# Patient Record
Sex: Male | Born: 1965 | Race: White | Hispanic: No | Marital: Married | Smoking: Current every day smoker
Health system: Southern US, Community
[De-identification: ages and names within clinical notes are randomized; demographics above are authoritative.]

## PROBLEM LIST (undated history)

## (undated) DIAGNOSIS — E119 Type 2 diabetes mellitus without complications: Secondary | ICD-10-CM

## (undated) DIAGNOSIS — S4291XA Fracture of right shoulder girdle, part unspecified, initial encounter for closed fracture: Secondary | ICD-10-CM

## (undated) DIAGNOSIS — R42 Dizziness and giddiness: Secondary | ICD-10-CM

## (undated) DIAGNOSIS — R519 Headache, unspecified: Secondary | ICD-10-CM

## (undated) DIAGNOSIS — F329 Major depressive disorder, single episode, unspecified: Secondary | ICD-10-CM

## (undated) DIAGNOSIS — E559 Vitamin D deficiency, unspecified: Secondary | ICD-10-CM

## (undated) DIAGNOSIS — F32A Depression, unspecified: Secondary | ICD-10-CM

## (undated) DIAGNOSIS — E7800 Pure hypercholesterolemia, unspecified: Secondary | ICD-10-CM

## (undated) DIAGNOSIS — F419 Anxiety disorder, unspecified: Secondary | ICD-10-CM

## (undated) HISTORY — DX: Dizziness and giddiness: R42

## (undated) HISTORY — DX: Pure hypercholesterolemia, unspecified: E78.00

## (undated) HISTORY — DX: Fracture of right shoulder girdle, part unspecified, initial encounter for closed fracture: S42.91XA

## (undated) HISTORY — DX: Headache, unspecified: R51.9

## (undated) HISTORY — DX: Type 2 diabetes mellitus without complications: E11.9

## (undated) HISTORY — DX: Vitamin D deficiency, unspecified: E55.9

## (undated) HISTORY — DX: Anxiety disorder, unspecified: F41.9

## (undated) HISTORY — DX: Depression, unspecified: F32.A

---
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
## 2019-05-27 DIAGNOSIS — R569 Unspecified convulsions: Secondary | ICD-10-CM

## 2019-05-27 HISTORY — DX: Unspecified convulsions: R56.9

## 2019-06-09 ENCOUNTER — Encounter: Payer: Self-pay | Admitting: *Deleted

## 2019-06-09 ENCOUNTER — Other Ambulatory Visit: Payer: Self-pay

## 2019-06-09 ENCOUNTER — Ambulatory Visit (INDEPENDENT_AMBULATORY_CARE_PROVIDER_SITE_OTHER): Payer: 59 | Admitting: Diagnostic Neuroimaging

## 2019-06-09 VITALS — BP 157/89 | HR 96 | Temp 97.4°F | Ht 70.0 in | Wt 185.0 lb

## 2019-06-09 DIAGNOSIS — R569 Unspecified convulsions: Secondary | ICD-10-CM | POA: Diagnosis not present

## 2019-06-09 DIAGNOSIS — R413 Other amnesia: Secondary | ICD-10-CM

## 2019-06-09 NOTE — Patient Instructions (Signed)
NEW ONSET SEIZURE - MRI brain, EEG  - According to  law, you can not drive unless you are seizure / syncope free for at least 6 months and under physician's care.   - Please maintain precautions. Do not participate in activities where a loss of awareness could harm you or someone else. No swimming alone, no tub bathing, no hot tubs, no driving, no operating motorized vehicles (cars, ATVs, motocycles, etc), lawnmowers, power tools or firearms. No standing at heights, such as rooftops, ladders or stairs. Avoid hot objects such as stoves, heaters, open fires. Wear a helmet when riding a bicycle, scooter, skateboard, etc. and avoid areas of traffic. Set your water heater to 120 degrees or less.   SNORING / APNEA - check sleep study
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
## 2019-06-12 ENCOUNTER — Ambulatory Visit (INDEPENDENT_AMBULATORY_CARE_PROVIDER_SITE_OTHER): Payer: 59 | Admitting: Diagnostic Neuroimaging

## 2019-06-12 ENCOUNTER — Other Ambulatory Visit: Payer: Self-pay

## 2019-06-12 DIAGNOSIS — R569 Unspecified convulsions: Secondary | ICD-10-CM | POA: Diagnosis not present

## 2019-06-12 DIAGNOSIS — R413 Other amnesia: Secondary | ICD-10-CM

## 2019-06-16 ENCOUNTER — Telehealth: Payer: Self-pay

## 2019-06-16 NOTE — Telephone Encounter (Signed)
I called patient's wife with EEG results.  Single right temporal discharge was noted, otherwise unremarkable.  Patient was taken to [facility] today for breakthrough seizures.  He has been started on antiseizure medication levetiracetam 1000 mg twice a day.  Patient is resting at home.  Suanne Marker, MD 06/16/2019, 5:45 PM Certified in Neurology, Neurophysiology and Neuroimaging  [facility] [address] [place], [place] [address] [phone]

## 2019-06-16 NOTE — Telephone Encounter (Signed)
Patient wife called to check on the status of the EEG results.   Please follow up

## 2019-06-16 NOTE — Procedures (Signed)
   [facility]  EEG (ELECTROENCEPHALOGRAM) REPORT   STUDY DATE: 06/12/19 PATIENT NAME: Bernard Green DOB: [birth year] MRN: [id number]  ORDERING CLINICIAN: Joycelyn Schmid, MD   TECHNOLOGIST: Charlett Blake TECHNIQUE: Electroencephalogram was recorded utilizing standard 10-20 system of lead placement and reformatted into average and bipolar montages.  RECORDING TIME: 27 minutes ACTIVATION: hyperventilation and photic stimulation  CLINICAL INFORMATION: 54 year old male with new onset seizure.  FINDINGS: Posterior dominant background rhythms, which attenuate with eye opening, ranging 13-14 hertz and 15-20 microvolts.  Single right temporal sharp wave noted, mainly over T4 and T6 electrodes.  No other focal, lateralizing, epileptiform activity or seizures are seen. Patient recorded in the awake and drowsy state. EKG channel shows regular rhythm of 80-85 beats per minute.   IMPRESSION:   Normal background rhythms in the awake and drowsy states.  Single right temporal sharp wave noted over T4, T6 electrodes noted, but of unclear significance.    INTERPRETING PHYSICIAN:  Suanne Marker, MD Certified in Neurology, Neurophysiology and Neuroimaging  [facility] [address], [address] [place], [place] [address] [phone]

## 2019-06-16 NOTE — Telephone Encounter (Signed)
Patient wife called wanting to know if the patients MRI referral could be sent to the [facility] in [place] due to the next available with [facility] imaging being until February and they were advised by ED to get something sooner. Patient wife states patient suffered from a seizure last night and needs to get a MRI sooner.

## 2019-06-17 ENCOUNTER — Institutional Professional Consult (permissible substitution): Payer: 59 | Admitting: Neurology

## 2019-06-17 ENCOUNTER — Other Ambulatory Visit: Payer: 59

## 2019-06-17 NOTE — Telephone Encounter (Signed)
Noted. Patient has MRI brain scheduled for 07/09/19. Return FU in July.

## 2019-06-18 ENCOUNTER — Ambulatory Visit: Payer: 59

## 2019-06-18 ENCOUNTER — Other Ambulatory Visit: Payer: Self-pay

## 2019-06-18 DIAGNOSIS — R413 Other amnesia: Secondary | ICD-10-CM

## 2019-06-18 DIAGNOSIS — R569 Unspecified convulsions: Secondary | ICD-10-CM | POA: Diagnosis not present

## 2019-06-18 MED ORDER — GADOBENATE DIMEGLUMINE 529 MG/ML IV SOLN
15.0000 mL | Freq: Once | INTRAVENOUS | Status: AC | PRN
  Administered 2019-06-18: 15 mL via INTRAVENOUS

## 2019-06-23 ENCOUNTER — Telehealth: Payer: Self-pay | Admitting: *Deleted

## 2019-06-23 NOTE — Telephone Encounter (Signed)
Spoke with patient and informed him his MRI brain result is normal. He has had EEG, stated stress test is later this week. His wife is setting up sleep study. I emailed him my chart activation code at his request. Patient verbalized understanding, appreciation.

## 2019-06-24 NOTE — Telephone Encounter (Signed)
Patient has been seen for His MRI already

## 2019-06-30 ENCOUNTER — Other Ambulatory Visit: Payer: Self-pay

## 2019-06-30 ENCOUNTER — Ambulatory Visit (INDEPENDENT_AMBULATORY_CARE_PROVIDER_SITE_OTHER): Payer: 59 | Admitting: Neurology

## 2019-06-30 ENCOUNTER — Encounter: Payer: Self-pay | Admitting: Neurology

## 2019-06-30 VITALS — BP 148/88 | HR 81 | Temp 97.0°F | Ht 70.0 in | Wt 178.0 lb

## 2019-06-30 DIAGNOSIS — G9340 Encephalopathy, unspecified: Secondary | ICD-10-CM

## 2019-06-30 DIAGNOSIS — R413 Other amnesia: Secondary | ICD-10-CM | POA: Diagnosis not present

## 2019-06-30 DIAGNOSIS — Z72820 Sleep deprivation: Secondary | ICD-10-CM

## 2019-06-30 DIAGNOSIS — R569 Unspecified convulsions: Secondary | ICD-10-CM | POA: Diagnosis not present

## 2019-06-30 DIAGNOSIS — G40909 Epilepsy, unspecified, not intractable, without status epilepticus: Secondary | ICD-10-CM | POA: Diagnosis not present

## 2019-06-30 NOTE — Progress Notes (Signed)
SLEEP MEDICINE CLINIC    Provider:  Larey Seat, MD  Primary Care Physician:  Sherald Hess., MD [place] [id number]     Referring Provider: [facility]   Royce Macadamia, Mike Gip., Md [place],  [place] [address]          Chief Complaint according to patient   Patient presents with:    . New Patient (Initial Visit)           HISTORY OF PRESENT ILLNESS:  Bernard Green is a 54 y.o. year old Caucasian male patient seen here upon a referral on 06/30/2019.   Chief concern according to patient :  patint is seizure free for the last  2 weeks, had his first seizure 12-30-20202, and normal stress tests, abnormal  Right carotid doppler indication, stenosis - awaiting CTA. CT and MRI brain were normal.   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  he injured his right shoulder in a seizure activity. He  has a past medical history of Anxiety and depression, Diabetes ([place]), Dizziness, Headache, Hypercholesterolemia, Seizures ([place]) (05/27/2019), Shoulder fracture, right, and Vitamin D deficiency..He has been worked up for seizures and memory loss at [facility], where his neurologist wants him to have a sleep study. He is seeing a spine specialist in [place] tomorrow.     Sleep relevant medical history: Nocturia - rare , cervical spine trauma-    Family medical /sleep history:  No other family member  With seizures and nobody on CPAP with OSA.  Social history:  Patient is qualified as an Engineer, water -and lives in a household with 2 persons- 2 adult sons.   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  Pets are not present. Tobacco use- current.  Smoking for 35 years, cigarettes, 1 ppd.   ETOH use seldom- prior to December 2020, 12-18 beers a week. ,  Caffeine intake in form of Coffee( 2-3) Soda( not since 2020) Tea ( none ) or energy  drinks. Regular exercise: not now.      Sleep habits are as follows: The patient's dinner time is between 6 PM. The patient goes to bed at 10 PM and continues to sleep for many hours, wakesrarely bathroom breaks,  He has some shoulder pain since recently- he can't sleep on his right- The preferred sleep position is now supine , with the support of 2 pillows. Dreams are reportedly rare..  6  AM is the usual rise time. The patient wakes up spontaneously.  He reports feeling refreshed / restored in AM, without symptoms such as dry mouth,  Has no morning headaches, and residual fatigue. No naps in daytime, sometimes Sunday afternoon for 1 hour.   Both seizures ha[end in sleep, he laughed loud and was not responsive, his eye turned up, to look up- not side to side, he was not fisted. His whoe body shook.    Review of Systems: Out of a complete 14 system review, the patient complains of only the following symptoms, and all other reviewed systems are negative.:  Fatigue, sleepiness , snoring, fragmented sleep, Medication has caused some time fatigue.    How likely are you to doze in the following situations: 0 = not likely, 1 = slight chance, 2 = moderate chance, 3 = high chance   Sitting and Reading? Watching Television?  Sitting inactive in a public place (theater or meeting)? As a passenger in a car for an hour without a break? Lying down in the afternoon when circumstances permit? Sitting and talking to someone? Sitting quietly after lunch without alcohol? In a car, while stopped for a few minutes in traffic?   Total = 7/ 24 points   FSS endorsed at 27/ 63 points.   Social History   Socioeconomic History  . Marital status: Married    Spouse name: Bernard Green  . Number of children: 2  . Years of education: Not on file  . Highest education level: Some college, no degree  Occupational History    Comment: general management  Tobacco Use  . Smoking status: Current Every Day Smoker     Years: 1.00    Last attempt to quit: 06/08/2017    Years since quitting: 2.0  . Smokeless tobacco: Never Used  . Tobacco comment: started back 02/2019  Substance and Sexual Activity  . Alcohol use: Yes    Comment: occass beer  . Drug use: Never  . Sexual activity: Not on file  Other Topics Concern  . Not on file  Social History Narrative   Lives with wife   Caffeine- coffee 2-3 c daily   Social Determinants of Health   Financial Resource Strain:   . Difficulty of Paying Living Expenses: Not on file  Food Insecurity:   . Worried About Programme researcher, broadcasting/film/video in the Last Year: Not on file  . Ran Out of Food in the Last Year: Not on file  Transportation Needs:   . Lack of Transportation (Medical): Not on file  . Lack of Transportation (Non-Medical): Not on file  Physical Activity:   . Days of Exercise per Week: Not on file  . Minutes of Exercise per Session: Not on file  Stress:   . Feeling of Stress : Not on file  Social Connections:   . Frequency of Communication with Friends and Family: Not on file  . Frequency of Social Gatherings with Friends and Family: Not on file  . Attends Religious Services: Not on file  . Active Member of Clubs or Organizations: Not on file  . Attends Banker Meetings: Not on file  . Marital Status: Not on file    Family History  Problem Relation Age of Onset  . Lung cancer Father   . Heart disease Father   . Heart disease Maternal Grandfather   . Heart disease Paternal Grandfather   . Suicidality Brother     Past Medical History:  Diagnosis Date  . Anxiety and depression   . Diabetes ([facility])   . Dizziness   . Headache   . Hypercholesterolemia   . Seizures ([facility]) 05/27/2019  . Shoulder fracture, right   . Vitamin D deficiency     No past surgical history on file.   Current Outpatient Medications on File Prior to Visit  Medication Sig Dispense Refill  . aspirin EC 81 MG tablet Take 81 mg by mouth daily.    .  Cholecalciferol (VITAMIN D3 PO) Take by mouth. Unknown dose    . levETIRAcetam (KEPPRA) 1000 MG tablet Take 1,000 mg by mouth 2 (two) times daily.    . metFORMIN (GLUCOPHAGE) 500 MG tablet Take by mouth daily with breakfast.     No current facility-administered medications on file prior to visit.   Physical exam:  Today's Vitals   06/30/19 1004  BP: (!) 148/88  Pulse: 81  Temp: [place] Kitchen)  97 F (36.1 C)  Weight: 178 lb (80.7 kg)  Height: 5\' 10"  (1.778 m)   Body mass index is 25.54 kg/m.   Wt Readings from Last 3 Encounters:  06/30/19 178 lb (80.7 kg)  06/09/19 185 lb (83.9 kg)     Ht Readings from Last 3 Encounters:  06/30/19 5\' 10"  (1.778 m)  06/09/19 5\' 10"  (1.778 m)      General: The patient is awake, alert and appears not in acute distress. The patient is well groomed. Head: Normocephalic, atraumatic.  Neck is supple. Mallampati 2,  neck circumference:15 inches . Nasal airflow  patent.  Retrognathia is  seen.  Dental status: intact   The patient suffered a tongue bite on the left side. No incontience.  Cardiovascular:  Regular rate and cardiac rhythm by pulse,  without distended neck veins. Respiratory: Lungs are clear to auscultation.  Skin:  Without evidence of ankle edema, or rash. Trunk: The patient's posture is erect.   Neurologic exam : The patient is awake and alert, oriented to place and time.   Memory subjective described as intact.  Attention span & concentration ability appears normal.  Speech is fluent,  without  dysarthria, dysphonia or aphasia.  Mood and affect are appropriate.   Cranial nerves: no loss of smell or taste reported  Pupils are equal and briskly reactive to light. Funduscopic exam deferred.   Extraocular movements in vertical and horizontal planes were intact and without nystagmus. No Diplopia. Visual fields by finger perimetry are intact. Hearing was intact to soft voice and finger rubbing.    Facial sensation intact to fine touch.   Facial motor strength is symmetric and tongue and uvula move midline.  Neck ROM : rotation, tilt and flexion extension were normal for age and shoulder shrug was symmetrical.    Motor exam:  Symmetric bulk, tone and ROM.   Normal tone without cog wheeling, symmetric grip strength .   Sensory:  Fine touch, pinprick and vibration were tested  and  normal.  Proprioception tested in the upper extremities was normal.   Coordination: Rapid alternating movements in the fingers/hands were of normal speed.  The Finger-to-nose maneuver was intact without evidence of ataxia, dysmetria or tremor.   Gait and station: Patient could rise unassisted from a seated position, walked without assistive device.  Stance is of normal width/ base and the patient turned with 3 steps.  Toe and heel walk were deferred.  Deep tendon reflexes: in the  upper and lower extremities are symmetric and intact.  Babinski response was  normal        After spending a total time of  40 minutes face to face and additional time for physical and neurologic examination, review of laboratory studies,  personal review of imaging studies, reports and results of other testing and review of referral information / records as far as provided in visit, I have established the following assessments:  1) seizures related to sleep. He reportedly having laughed loud and then went t into convulsion and tongue bite. He was postictally confused and exhausted.   2) sleep study we ill need to have a  full EEG set- Beau please. His seizure was within the first 2 hours of sleep.    I reviewed his labs, ED reports and his existing. EEG and MRI brain were performed with Dr .       NEUROIMAGING REPORT   STUDY DATE: 06/18/19 PATIENT NAME: ETHELBERT THAIN DOB: [birth year] MRN: 06/20/19  ORDERING  CLINICIAN: Penumalli, Glenford Bayley, MD  CLINICAL HISTORY: 54 year old male with new onset seizure.  EXAM: MR BRAIN W WO CONTRAST    TECHNIQUE: MRI of the brain with and without contrast was obtained utilizing 5 mm axial slices with T1, T2, T2 flair, T2 star gradient echo and diffusion weighted views.  T1 sagittal, T2 coronal and postcontrast views in the axial and coronal plane were obtained. CONTRAST: 83ml multihance  COMPARISON: none  IMAGING SITE: [facility] [address] (1.5 Tesla MRI)   FINDINGS:   No abnormal lesions are seen on diffusion-weighted views to suggest acute ischemia. The cortical sulci, fissures and cisterns are normal in size and appearance. Lateral, third and fourth ventricle are normal in size and appearance. No extra-axial fluid collections are seen. No evidence of mass effect or midline shift.  No abnormal lesions are seen on post contrast views.    On coronal views no mesial temporal sclerosis or hippocampal atrophy.  On sagittal views the posterior fossa, pituitary gland and corpus callosum are unremarkable. No evidence of intracranial hemorrhage on gradient-echo views. The orbits and their contents, paranasal sinuses and calvarium are unremarkable.  Intracranial flow voids are present.   IMPRESSION:   Normal MRI brain (with and without).     INTERPRETING PHYSICIAN:  Suanne Marker, MD Certified in Neurology, Neurophysiology and Neuroimaging   EEG (ELECTROENCEPHALOGRAM) REPORT    STUDY DATE: 06/12/19 PATIENT NAME: TREV BOLEY DOB: 12-01-1965 MRN: [id number]  ORDERING CLINICIAN: Joycelyn Schmid, MD   TECHNOLOGIST: Charlett Blake TECHNIQUE: Electroencephalogram was recorded utilizing standard 10-20 system of lead placement and reformatted into average and bipolar montages.  RECORDING TIME: 27 minutes ACTIVATION: hyperventilation and photic stimulation  CLINICAL INFORMATION: 54 year old male with new onset seizure.  FINDINGS: Posterior dominant background rhythms, which attenuate with eye opening, ranging 13-14 hertz and 15-20 microvolts.   Single right temporal sharp wave noted, mainly over T4 and T6 electrodes.  No other focal, lateralizing, epileptiform activity or seizures are seen. Patient recorded in the awake and drowsy state. EKG channel shows regular rhythm of 80-85 beats per minute.   IMPRESSION:   Normal background rhythms in the awake and drowsy states.  Single right temporal sharp wave noted over T4, T6 electrodes noted, but of unclear significance.      My Plan is to proceed with:  1) full EEG sleep study. 2) CT angio neck and brain.     I would like to thank Frederich Chick., MD and Joycelyn Schmid, MD  [address] [place],  [place] [address] for allowing me to meet with and to take care of this pleasant patient.   In short, JONMICHAEL BEADNELL is presenting with sleep related seizure activity., a symptom that can be attributed to epilepsy .   I plan to follow up either personally or through our NP within 2-3 month.   CC: I will share my notes with PCP .  Electronically signed by: Melvyn Novas, MD 06/30/2019 10:22 AM  [facility] Board certified by The [employer] of Sleep Medicine and Diplomate of the [employer] of Sleep Medicine. Board certified In Neurology through the ABPN, Fellow of the [employer] of Neurology. Medical Director of [facility].

## 2019-07-02 ENCOUNTER — Telehealth: Payer: Self-pay | Admitting: Neurology

## 2019-07-02 MED ORDER — LEVETIRACETAM 750 MG PO TABS: 1500.0000 mg | ORAL_TABLET | Freq: Two times a day (BID) | ORAL | 12 refills | Status: AC

## 2019-07-02 NOTE — Telephone Encounter (Addendum)
Called wife and advised her Dr Marjory Lies sent in a new Rx for levetiracetam 750 mg tabs, take two twice daily. She asked if she should increase dose tonight, and I advised she should. She stated his  Stress test came out fine. She asked if he needs to be seen. I advised that they call for any seizure activity but at this time, follow up not needed. Dr Denyse Amass will wait for result sf sleep study/EEG.  She then stated she read that metformin can cause seizures, and she would like Dr Richrd Humbles thought. I advised will send question to him.  Bernard Green verbalized understanding, appreciation.

## 2019-07-02 NOTE — Addendum Note (Signed)
Addended by: Joycelyn Schmid R on: 07/02/2019 02:46 PM   Modules accepted: Orders

## 2019-07-02 NOTE — Telephone Encounter (Signed)
Gaffin,MICHELLE(wife on DPR) has called to report that since pt last saw Dr Marjory Lies in January he has had 3 more seizures, the last being last night.  Wife was asked if she wanted to schedule an appointment she did not want to wait until March for an appointment.  Wife is concerned about the increase in seizure activity.  Please call

## 2019-07-02 NOTE — Telephone Encounter (Signed)
Not that I am aware of, unless pt has hypoglycemia. -VRP

## 2019-07-02 NOTE — Telephone Encounter (Signed)
Increase levetiracetam to 1500mg  twice a day.   Meds ordered this encounter  Medications  . levETIRAcetam (KEPPRA) 750 MG tablet    Sig: Take 2 tablets (1,500 mg total) by mouth 2 (two) times daily.    Dispense:  120 tablet    Refill:  12    , MD 07/02/2019, 2:46 PM Certified in Neurology, Neurophysiology and Neuroimaging  [facility] [address], [address] [place], [place] [phone]

## 2019-07-02 NOTE — Telephone Encounter (Addendum)
Called [name] who stated patient's first seizure was 2 weeks ago, and he went to [facility], was prescribed levetiracetam 1000 mg twice daily. He had another the next day. He has continued on medication as prescribed but had a third seizure last night. I advised will discuss with Dr Marjory Lies and call her back with recommendations. [name] stated his sleep study with EEG is on 07/13/19, verbalized understanding, appreciation.

## 2019-07-03 NOTE — Telephone Encounter (Signed)
Agree. No drug interaction with levetiracetam. -VRP

## 2019-07-03 NOTE — Telephone Encounter (Addendum)
Called wife and advised her of Dr Richrd Humbles reply. She verbalized understanding, appreciation. She then stated the cardiologist put patient on lisinopril 5 mg for BP. She hasn't started med yet, wanting to know if it's ok to take with increased dose of Keppra. I advised her it should be fine, and the prescribing MD would most likely not have prescribed if there was an issue. I also advised his pharmacist would call MD to double check if there was contraindication.  I advised will ask Dr Marjory Lies and only call her if he advises patient shouldn't be on lisinopril. She  verbalized understanding, appreciation.

## 2019-07-07 ENCOUNTER — Telehealth: Payer: Self-pay | Admitting: Neurology

## 2019-07-07 NOTE — Telephone Encounter (Signed)
CTA Head is in reconsideration I faxed the notes and the MRI results.  The CTA Neck is in pending and I also faxed notes.

## 2019-07-08 NOTE — Telephone Encounter (Signed)
CTA Neck [employer] Auth: [id number] (exp. 07/08/19 to 08/21/19)  CTA Head [employer] Auth: [id number] (exp. 07/02/19 to 08/21/19)  Orders sent to GI they will reach out to the patient to schedule.

## 2019-07-09 ENCOUNTER — Other Ambulatory Visit: Payer: Self-pay

## 2019-07-13 ENCOUNTER — Ambulatory Visit (INDEPENDENT_AMBULATORY_CARE_PROVIDER_SITE_OTHER): Payer: 59 | Admitting: Neurology

## 2019-07-13 DIAGNOSIS — R0683 Snoring: Secondary | ICD-10-CM

## 2019-07-13 DIAGNOSIS — R413 Other amnesia: Secondary | ICD-10-CM

## 2019-07-13 DIAGNOSIS — R569 Unspecified convulsions: Secondary | ICD-10-CM

## 2019-07-18 ENCOUNTER — Ambulatory Visit
Admission: RE | Admit: 2019-07-18 | Discharge: 2019-07-18 | Disposition: A | Discharge status: Home / Self Care | Payer: 59 | Source: Ambulatory Visit | Attending: Neurology | Admitting: Neurology

## 2019-07-18 DIAGNOSIS — G9340 Encephalopathy, unspecified: Secondary | ICD-10-CM

## 2019-07-18 IMAGING — CT CT ANGIO NECK
1 of 4 series · 2 of 16 positions shown · IV contrast (APPLIED)
Comparison: Head MRI [DATE]

CLINICAL DATA: Encephalopathy. Episodic confusion for 3-6 months.

Creatinine was obtained on site at [HOSPITAL] at [HOSPITAL].
Results: Creatinine 0.7 mg/dL.
EXAM:
CT ANGIOGRAPHY HEAD AND NECK
TECHNIQUE: Multidetector CT imaging of the head and neck was performed using
the standard protocol during bolus administration of intravenous
contrast. Multiplanar CT image reconstructions and MIPs were
obtained to evaluate the vascular anatomy. Carotid stenosis
measurements (when applicable) are obtained utilizing NASCET
criteria, using the distal internal carotid diameter as the
denominator.
CONTRAST:  75mL [8R] IOPAMIDOL ([8R]) INJECTION 76%

[Series 6: head/neck angio · axial · 0.50mm/px · z∈[-265,-141]mm · 2 of 188 slices shown]
[im 63/188  soft-tissue]
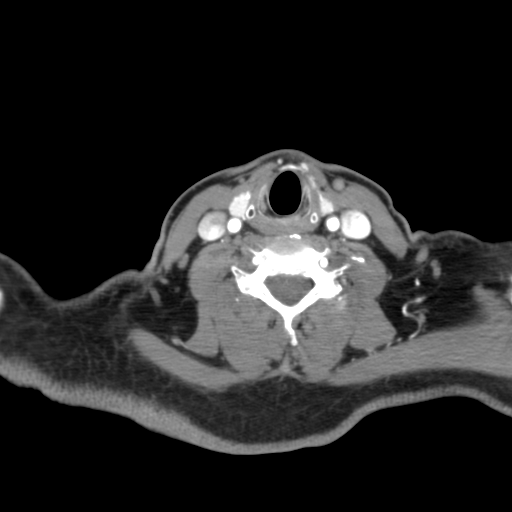
[im 125/188  bone]
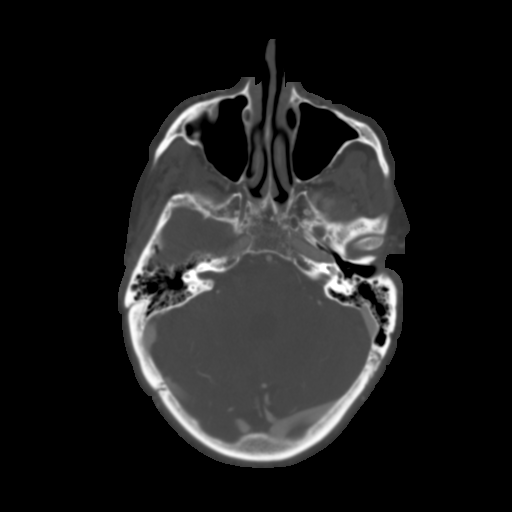

[2 of 16 positions shown; findings below may reference images not displayed]

FINDINGS: CT HEAD FINDINGS

Brain: There is no evidence of acute infarct, intracranial
hemorrhage, mass, midline shift, or extra-axial fluid collection.
The ventricles and sulci are within normal limits for age.

Vascular: No hyperdense vessel.

Skull: No fracture or suspicious osseous lesion.

Sinuses: Mild mucosal thickening in the ethmoid and maxillary
sinuses bilaterally. Clear mastoid air cells.

Orbits: Unremarkable.

Review of the MIP images confirms the above findings

CTA NECK FINDINGS

Aortic arch: Normal variant aortic arch branching pattern with an
aberrant right subclavian artery which courses posterior to the
esophagus as well as a common origin of the common carotid arteries.
Mild arch atherosclerosis without arch vessel origin stenosis.

Right carotid system: Patent with mild calcified and soft plaque at
the carotid bifurcation. No evidence of significant stenosis or
dissection.

Left carotid system: Patent with minimal soft plaque in the common
carotid artery and carotid bulb. No evidence of significant stenosis
or dissection.

Vertebral arteries: Patent and codominant without evidence of
stenosis or dissection.

Skeleton: No acute osseous abnormality or suspicious osseous lesion.

Other neck: No evidence of cervical lymphadenopathy or mass.

Upper chest: Clear lung apices.

Review of the MIP images confirms the above findings

CTA HEAD FINDINGS

Anterior circulation: The internal carotid arteries are patent from
skull base to carotid termini with minimal nonstenotic plaque
bilaterally. ACAs and MCAs are patent without evidence of proximal
branch occlusion or significant proximal stenosis. There is a patent
anterior communicating artery. No aneurysm is identified.

Posterior circulation: The intracranial vertebral arteries are
widely patent to the basilar. Patent PICAs, AICAs, and SCAs are seen
bilaterally. The basilar artery is widely patent. There are large
left and diminutive or absent right posterior communicating
arteries. Both PCAs are patent without evidence of significant
proximal stenosis. No aneurysm is identified.

Venous sinuses: Patent.

Anatomic variants: None.

Review of the MIP images confirms the above findings
IMPRESSION: 1. Mild atherosclerosis in the head and neck without large vessel
occlusion, significant stenosis, or aneurysm.
2. Unremarkable CT appearance of the brain.
3.  Aortic Atherosclerosis ([8R]-[8R]).

## 2019-07-18 MED ORDER — IOPAMIDOL (ISOVUE-370) INJECTION 76%
75.0000 mL | Freq: Once | INTRAVENOUS | Status: AC | PRN
  Administered 2019-07-18: 75 mL via INTRAVENOUS

## 2019-07-18 NOTE — Progress Notes (Signed)
No aneurysm is identified.  Venous sinuses: Patent.  Anatomic variants: None.  Review of the MIP images confirms the above findings  IMPRESSION: 1. Mild atherosclerosis in the head and neck without large vessel occlusion, significant stenosis, or aneurysm. 2. Unremarkable CT appearance of the brain. 3.  Aortic Atherosclerosis (ICD10-I70.0).

## 2019-07-21 ENCOUNTER — Telehealth: Payer: Self-pay | Admitting: Neurology

## 2019-07-21 NOTE — Telephone Encounter (Signed)
-----   Message from Melvyn Novas, MD sent at 07/18/2019  3:18 PM EST ----- No aneurysm is identified.  Venous sinuses: Patent.  Anatomic variants: None.  Review of the MIP images confirms the above findings  IMPRESSION: 1. Mild atherosclerosis in the head and neck without large vessel occlusion, significant stenosis, or aneurysm. 2. Unremarkable CT appearance of the brain. 3.  Aortic Atherosclerosis (ICD10-I70.0).

## 2019-07-21 NOTE — Telephone Encounter (Signed)
Called the patient's wife and advised of the Ct angio head and neck looked good. There as no indication of blockage that appears to be occluding blood flow. There was no evidence of aneurysm present as well. The pt's wife states that the confusion still remains a concern. Informed the wife that we are still waiting on Dr Vickey Huger to review the sleep study results in which case we will be able to see if there is anything that could be of concern on the study. Advised the wife I will be in contact and review the sleep study results once Dr Vickey Huger reviews and advised we will plan next steps based off what that shows. She was appreciative for the call back and had no other questions at this time.

## 2019-07-21 NOTE — Telephone Encounter (Addendum)
[name],Bernard Green(wife on DPR) has called stating the results to the MRI show on Mychart , she would like a call to discuss results as soon as possible.  Wife is also asking for a call with results to Sleep Study when available.  Pt wife has also asked that it be noted that since last Tues pt has been in a state of confusion and wife states it is pretty bad.  Pt wife is asking for a call to discuss.

## 2019-07-21 NOTE — Telephone Encounter (Signed)
I have called and reviewed the results with wife. See results phone note

## 2019-07-24 ENCOUNTER — Telehealth: Payer: Self-pay | Admitting: Neurology

## 2019-07-24 DIAGNOSIS — R413 Other amnesia: Secondary | ICD-10-CM | POA: Insufficient documentation

## 2019-07-24 DIAGNOSIS — R569 Unspecified convulsions: Secondary | ICD-10-CM | POA: Insufficient documentation

## 2019-07-24 NOTE — Telephone Encounter (Signed)
-----   Message from Melvyn Novas, MD sent at 07/24/2019  9:01 AM EST ----- IMPRESSION:   1. Clinically irrelevant level of Snoring related arousals and  Sleep Apnea (OSA)  2. No hypoxemia of sleep was recorded.  3. EEG was normal for age and sleep related architecture.  Reviewed in referential bipolar montage.  4. No significant Periodic Limb Movement Disorder (PLMD).  5. Loud Primary Snoring  6. normal EKG, NSR    RECOMMENDATIONS: Bernard Green snores, but no epileptiform activity  was noted during his sleep study, no other sleep related  abnormality was detected. There was little N 3 sleep, which is  expected for age.

## 2019-07-24 NOTE — Progress Notes (Signed)
IMPRESSION:   1. Clinically irrelevant level of Snoring related arousals and  Sleep Apnea (OSA)  2. No hypoxemia of sleep was recorded.  3. EEG was normal for age and sleep related architecture.  Reviewed in referential bipolar montage.  4. No significant Periodic Limb Movement Disorder (PLMD).  5. Loud Primary Snoring  6. normal EKG, NSR    RECOMMENDATIONS: Bernard Green snores, but no epileptiform activity  was noted during his sleep study, no other sleep related  abnormality was detected. There was little N 3 sleep, which is  expected for age.

## 2019-07-24 NOTE — Procedures (Signed)
PATIENT'S NAME:  Bernard Green, Bernard Green DOB:      Nov 13, 1965      MR#:    [id number]     DATE OF RECORDING: 07/13/2019 REFERRING M.D.:  Cristopher Estimable. Royce Macadamia, MD Study Performed:   Seizure Montage- Polysomnogram HISTORY:  Bernard Green is a 54 y.o. year old Caucasian male patient seen here upon a referral on 06/30/2019.This patient is seizure free for the last 2 weeks, had his first seizure 12-30-20202, and normal cardiac stress tests, abnormal right carotid doppler - stenosis - awaiting CTA. CT and MRI brain were normal.    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  He injured his right shoulder in a seizure activity. He has a past medical history of Anxiety and depression, Diabetes ([place]), Dizziness, Headache, Hypercholesterolemia, Seizures ([place]) (05/27/2019), Shoulder fracture, right, and Vitamin D deficiency. He has been worked up for seizures and memory loss at [facility], where his neurologist wants him to have a sleep study. He is seeing a spine specialist in [place] tomorrow.   Has no morning headaches, and only described snoring and residual fatigue. No naps in daytime.    Both seizures happened while asleep: "he laughed loud and was not responsive, his eye turned up, to look up- not side to side, he was not fisted. His whole body shook."   The patient endorsed the Epworth Sleepiness Scale at 7/24 points.   The patient's weight 170 pounds with a height of 70 (inches), resulting in a BMI of 24.3 kg/m2. The patient's neck circumference measured 15 inches.  CURRENT MEDICATIONS: Aspirin, Vitamin D3, Keppra, Glucophage.   PROCEDURE:  This is a multichannel digital polysomnogram utilizing the Somnostar 11.2 system.  Electrodes and sensors were applied and monitored per AASM Specifications.   EEG, EOG, Chin and Limb EMG, were sampled at 200 Hz.  ECG, Snore and Nasal Pressure, Thermal Airflow, Respiratory Effort, CPAP  Flow and Pressure, Oximetry was sampled at 50 Hz. Digital video and audio were recorded.      BASELINE STUDY: Lights Out was at 21:00 and Lights On at 05:00.  Total recording time (TRT) was 481 minutes, with a total sleep time (TST) of 418 minutes.   The patient's sleep latency was 44.5 minutes.  REM latency was 288.5 minutes.  The sleep efficiency was 86.9 %.     SLEEP ARCHITECTURE: WASO (Wake after sleep onset) was 18 minutes.  There were 7 minutes in Stage N1, 375 minutes Stage N2, 24.5 minutes Stage N3 and 11.5 minutes in Stage REM.  The percentage of Stage N1 was 1.7%, Stage N2 was 89.7%, Stage N3 was 5.9% and Stage R (REM sleep) was 2.8%.    RESPIRATORY ANALYSIS:  There were a total of 25 respiratory events:  5 obstructive apneas, 0 central apneas and 0 mixed apneas with a total of 5 apneas and an apnea index (AI) of .7 /hour. There were 20 hypopneas with a hypopnea index of 2.9 /hour. The patient also had 25 respiratory event related arousals (RERAs).      The total APNEA/HYPOPNEA INDEX (AHI) was 3.6/hour and the total RESPIRATORY DISTURBANCE INDEX was 7.2 /hour.  4 events occurred in REM sleep and 34 events in NREM. The REM AHI was  20.9 /hour, versus a non-REM AHI of 3.1. The patient spent 237.5 minutes of total sleep time in the supine position and 181 minutes in non-supine.. The supine AHI was 6.1/h  versus a non-supine AHI of 0.3.  OXYGEN SATURATION & C02:  The Wake baseline 02 saturation was 94%, with the lowest being 86%. Time spent below 89% saturation equaled 3 minutes.  The arousals were noted as: 53 were spontaneous, 0 were associated with PLMs, 11 were associated with respiratory events. The patient had a total of 10 Periodic Limb Movements.    Audio and video analysis did not show any abnormal or unusual movements, behaviors, phonations or vocalizations.   Snoring was noted. EKG was in keeping with normal sinus rhythm (NSR).   IMPRESSION:  1. Clinically irrelevant level of  Snoring related arousals and Sleep Apnea (OSA) 2. No hypoxemia of sleep was recorded. 3. EEG was normal for age and sleep related architecture. Reviewed in referential bipolar montage. 4. No significant Periodic Limb Movement Disorder (PLMD). 5. Loud Primary Snoring 6. normal EKG, NSR   RECOMMENDATIONS: Bernard Green snores, but no epileptiform activity was noted during his sleep study, no other sleep related abnormality was detected. There was little N 3 sleep, which is expected for age.    I certify that I have reviewed the entire raw data recording prior to the issuance of this report in accordance with the Standards of Accreditation of the American Academy of Sleep Medicine (AASM)  Melvyn Novas, MD Diplomat, American Board of Psychiatry and Neurology  Diplomat, American Board of Sleep Medicine Wellsite geologist, [facility] [employer] [facility]

## 2019-07-24 NOTE — Telephone Encounter (Signed)
Called and advised the pt and his wife that the sleep study was negative for apnea or any sleep disordered breathing. Heart rate and oxygen level stayed in normal range. EEG during sleep also appeared normal.  From Dr Dohmeier's standpoint there was nothing on the sleep study to indicate a cause behind the brain fog or confusion the patient is having. The patient wife asked for a follow up visit with Neurologist to discuss further concerns of the brain fog and confusion. Apt was made for Monday at 1:30 pm with check in of 1 pm with Dr Marjory Lies.

## 2019-07-28 ENCOUNTER — Ambulatory Visit (INDEPENDENT_AMBULATORY_CARE_PROVIDER_SITE_OTHER): Payer: 59 | Admitting: Diagnostic Neuroimaging

## 2019-07-28 ENCOUNTER — Other Ambulatory Visit: Payer: Self-pay

## 2019-07-28 ENCOUNTER — Encounter: Payer: Self-pay | Admitting: Diagnostic Neuroimaging

## 2019-07-28 VITALS — BP 137/83 | HR 81 | Temp 97.1°F | Ht 70.0 in | Wt 177.2 lb

## 2019-07-28 DIAGNOSIS — R413 Other amnesia: Secondary | ICD-10-CM

## 2019-07-28 DIAGNOSIS — G40909 Epilepsy, unspecified, not intractable, without status epilepticus: Secondary | ICD-10-CM

## 2019-07-28 NOTE — Patient Instructions (Signed)
SEIZURE DISORDER (last seizure 07/01/19)  - continue levetiracetam 1500mg  twice a day   - According to [place] law, you can not drive unless you are seizure / syncope free for at least 6 months and under physician's care.   - Please maintain precautions. Do not participate in activities where a loss of awareness could harm you or someone else. No swimming alone, no tub bathing, no hot tubs, no driving, no operating motorized vehicles (cars, ATVs, motocycles, etc), lawnmowers, power tools or firearms. No standing at heights, such as rooftops, ladders or stairs. Avoid hot objects such as stoves, heaters, open fires. Wear a helmet when riding a bicycle, scooter, skateboard, etc. and avoid areas of traffic. Set your water heater to 120 degrees or less.   MEMORY LOSS (? After effects of seizures; seizure medication side effect; stress reaction) - second opinion to academic center
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
## 2019-10-28 ENCOUNTER — Telehealth: Payer: Self-pay | Admitting: *Deleted

## 2019-10-28 DIAGNOSIS — R413 Other amnesia: Secondary | ICD-10-CM

## 2019-10-28 DIAGNOSIS — G40909 Epilepsy, unspecified, not intractable, without status epilepticus: Secondary | ICD-10-CM

## 2019-10-28 NOTE — Telephone Encounter (Signed)
Follow up with Dr. Laveda Norman [facility] neurology). Looks like patient was referred to memory clinic at [facility] as well. -VRP

## 2019-10-28 NOTE — Telephone Encounter (Signed)
Reviewed notes; would request second opinion workup to continue through academic center. -VRP

## 2019-10-28 NOTE — Telephone Encounter (Addendum)
Received call from wife [name] , on [place] who stated patient had 5 hour Memory test at [facility] with Dr Charise Killian., neuropyschlogist.  Wife stated Dr Virgilio Frees reviewed test results and told patient that he possibly has infection in his brain or cancer. Dr Virgilio Frees is recommending blood work, his past  MRI to be read again and for him to possibly have another MRI. She said there's a lab to check for cancer anywhere in body. She advised patient that because he was referred to her that his "original "neurologist has to order these tests. The other provider he has seen at [facility] is neuro/epilepsy MD, not general neurologist.  I advised wife will send message to Dr Lewie Loron. [name] verbalized understanding, appreciation.

## 2019-10-28 NOTE — Telephone Encounter (Signed)
Received neuropysch notes via my chart, printed and placed on MD's desk for review, re: phone call with wife.

## 2019-10-28 NOTE — Telephone Encounter (Signed)
Called wife and advised her of Dr Richrd Humbles reply. She verbalized concern that her husband wasn't getting testing he needs. She stated his memory issues aren't due to depression, and she wants to know what is causing it. She stated the [facility] neurologist told her he is only seeing patient for epilepsy, not memory. I advised will discuss with Dr Marjory Lies.

## 2019-10-29 NOTE — Telephone Encounter (Addendum)
Called Dr Carmela Rima office, spoke with Trey Paula RN in nursing office and explained that patient's wife was asking Dr Marjory Lies to order testing recommended by Dr Hollice Espy. Patient was referred to Dr Virgilio Frees by Dr Laveda Norman.  Trey Paula advised he could see notes referencing the labs which are to be ordered by [facility] neurology. He stated he will call patient's wife and clarify with her that his ongoing care is being taken care of by [facility] neurology. Trey Paula  verbalized understanding, appreciation. Bernard Green's best call back [phone], opt #4.

## 2019-10-30 NOTE — Telephone Encounter (Signed)
Called Bernard Green and advised her of my conversation yesterday with Laurina Bustle, Dr Carmela Rima office. She stated she hasn't heard form them re: labs. I gave her Jeff's #; she stated she will call and  verbalized understanding, appreciation.

## 2019-11-05 NOTE — Telephone Encounter (Signed)
Patient and wife in office and recieved his medical records.  Patient stated he would like new referral to [facility], Sandre Kitty, stated he called today and can be seen within a week.  Dr Marjory Lies wrote referral on Rx pad which was given to patient. Patient was made aware the paper referral would be faxed to [facility] today,  verbalized understanding, appreciation.  New referral placed in Epic, printed and successfully faxed to [facility] Neuro.

## 2019-11-05 NOTE — Addendum Note (Signed)
Addended by: [place] Pink on: 11/05/2019 04:49 PM   Modules accepted: Orders

## 2019-11-05 NOTE — Telephone Encounter (Signed)
Pt's wife has called asking that [facility] calls her re: the referral to another neurologist since Dr Marjory Lies will no longer see the pt.  Pt's wife stated they are on the way to the office for pt's records and she would like MaryC,RN to have the referral prepared for when she gets here.  Wife was told there is an allowance of 24-48 hours for messages to be responded to and that phone rep could not guarantee that this request would be processed within the hour drive it will take wife and pt to get her.

## 2019-12-04 ENCOUNTER — Telehealth: Payer: Self-pay | Admitting: *Deleted

## 2019-12-04 NOTE — Telephone Encounter (Signed)
Spoke with patient and informed hm he has a FU on 12/08/19 that was made in Jan. He is now seeing neurology at [facility]; I advised we will cancel this FU unless he wants to be seen. He stated he will give message to his wife and have her call back about whether to cancel it or not.  Patient verbalized understanding, appreciation.

## 2019-12-08 ENCOUNTER — Ambulatory Visit: Payer: 59 | Admitting: Diagnostic Neuroimaging
